# Patient Record
Sex: Female | Born: 1965 | Hispanic: No | Marital: Married | Smoking: Never smoker
Health system: Southern US, Community
[De-identification: ages and names within clinical notes are randomized; demographics above are authoritative.]

## PROBLEM LIST (undated history)

## (undated) DIAGNOSIS — E559 Vitamin D deficiency, unspecified: Secondary | ICD-10-CM

---

## 2014-07-15 ENCOUNTER — Other Ambulatory Visit: Payer: Self-pay | Admitting: Anesthesiology

## 2014-07-15 ENCOUNTER — Ambulatory Visit
Admission: RE | Admit: 2014-07-15 | Discharge: 2014-07-15 | Disposition: A | Discharge status: Home / Self Care | Payer: Self-pay | Source: Ambulatory Visit | Attending: Anesthesiology | Admitting: Anesthesiology

## 2014-07-15 DIAGNOSIS — M25562 Pain in left knee: Secondary | ICD-10-CM

## 2018-05-22 ENCOUNTER — Emergency Department (HOSPITAL_BASED_OUTPATIENT_CLINIC_OR_DEPARTMENT_OTHER)
Admission: EM | Admit: 2018-05-22 | Discharge: 2018-05-22 | Disposition: A | Discharge status: Home / Self Care | Payer: BLUE CROSS/BLUE SHIELD | Attending: Emergency Medicine | Admitting: Emergency Medicine

## 2018-05-22 ENCOUNTER — Other Ambulatory Visit: Payer: Self-pay

## 2018-05-22 ENCOUNTER — Emergency Department (HOSPITAL_BASED_OUTPATIENT_CLINIC_OR_DEPARTMENT_OTHER): Payer: BLUE CROSS/BLUE SHIELD

## 2018-05-22 ENCOUNTER — Encounter (HOSPITAL_BASED_OUTPATIENT_CLINIC_OR_DEPARTMENT_OTHER): Payer: Self-pay | Admitting: *Deleted

## 2018-05-22 DIAGNOSIS — R079 Chest pain, unspecified: Secondary | ICD-10-CM | POA: Diagnosis present

## 2018-05-22 DIAGNOSIS — R1012 Left upper quadrant pain: Secondary | ICD-10-CM | POA: Insufficient documentation

## 2018-05-22 DIAGNOSIS — Z79899 Other long term (current) drug therapy: Secondary | ICD-10-CM | POA: Diagnosis not present

## 2018-05-22 HISTORY — DX: Vitamin D deficiency, unspecified: E55.9

## 2018-05-22 LAB — HEPATIC FUNCTION PANEL
ALT: 26 U/L (ref 0–44)
AST: 26 U/L (ref 15–41)
Albumin: 4 g/dL (ref 3.5–5.0)
Alkaline Phosphatase: 70 U/L (ref 38–126)
TOTAL PROTEIN: 7.7 g/dL (ref 6.5–8.1)
Total Bilirubin: 0.4 mg/dL (ref 0.3–1.2)

## 2018-05-22 LAB — BASIC METABOLIC PANEL
ANION GAP: 7 (ref 5–15)
BUN: 15 mg/dL (ref 6–20)
CALCIUM: 9.1 mg/dL (ref 8.9–10.3)
CO2: 28 mmol/L (ref 22–32)
CREATININE: 0.66 mg/dL (ref 0.44–1.00)
Chloride: 103 mmol/L (ref 98–111)
GFR calc Af Amer: >60 mL/min (ref >60)
GFR calc non Af Amer: >60 mL/min (ref >60)
Glucose, Bld: 90 mg/dL (ref 70–99)
Potassium: 3.8 mmol/L (ref 3.5–5.1)
SODIUM: 138 mmol/L (ref 135–145)

## 2018-05-22 LAB — TROPONIN I

## 2018-05-22 LAB — CBC
HCT: 40.7 % (ref 36.0–46.0)
HEMOGLOBIN: 13.9 g/dL (ref 12.0–15.0)
MCH: 29 pg (ref 26.0–34.0)
MCHC: 34.2 g/dL (ref 30.0–36.0)
MCV: 85 fL (ref 78.0–100.0)
PLATELETS: 240 10*3/uL (ref 150–400)
RBC: 4.79 MIL/uL (ref 3.87–5.11)
RDW: 13.1 % (ref 11.5–15.5)
WBC: 8.8 10*3/uL (ref 4.0–10.5)

## 2018-05-22 LAB — LIPASE, BLOOD: Lipase: 28 U/L (ref 11–51)

## 2018-05-22 MED ORDER — GI COCKTAIL ~~LOC~~
30.0000 mL | Freq: Once | ORAL | Status: AC
  Administered 2018-05-22: 30 mL via ORAL
  Filled 2018-05-22: qty 30

## 2018-05-22 NOTE — Discharge Instructions (Signed)
Your lab tests are normal.  Your EKG and chest x-ray are normal as well.  I am unsure exactly what is causing her pain.  The 2 most likely things are you pulled a muscle in your chest wall or you are having reflux disease.  I will have you do a trial of Zantac or Pepcid, take either 1 twice a day for the next week.  Try to avoid things that may make this worse like spicy foods tomato based products fatty foods chocolate or peppermint.  Please schedule an appointment with your family physician.  Return for fevers denies any pain or inability to eat or drink.

## 2018-05-22 NOTE — ED Notes (Signed)
Pt reports GI cocktail did not provide relief.

## 2018-05-22 NOTE — ED Triage Notes (Signed)
Pt reports over one week of back pain, no injury, mid back radiating to left breast area, tender to palp at triage, increases with movements and positioning, denies sob, dizzyness, nausea or diaphoresis. Pt is guarding her left side at triage.

## 2018-05-22 NOTE — ED Provider Notes (Signed)
[facility] EMERGENCY DEPARTMENT Provider Note   CSN: [id number] Arrival date & time: 05/22/18  1229     History   Chief Complaint Chief Complaint  Patient presents with  . Chest Pain    HPI Valerie Hardy is a 52 y.o. female.  52 yo F with LUQ abdominal pain.  Going on for the past month.  Not sure what makes it better or worse.  Describes as burning.  Not worse with foods, denies injury, denies vomiting.  No prior abdominal surgery, denies rash.    The history is provided by the patient.  Chest Pain   This is a new problem. The current episode started more than 1 week ago. The problem occurs constantly. The problem has not changed since onset.The pain is present in the lateral region. The pain is at a severity of 4/10. The pain is moderate. The quality of the pain is described as burning. The pain does not radiate. Duration of episode(s) is 3 months. Associated symptoms include abdominal pain. Pertinent negatives include no dizziness, no fever, no headaches, no nausea, no palpitations, no shortness of breath and no vomiting. She has tried nothing for the symptoms. The treatment provided no relief.    Past Medical History:  Diagnosis Date  . Vitamin D deficiency     There are no active problems to display for this patient.   History reviewed. No pertinent surgical history.   OB History   None      Home Medications    Prior to Admission medications   Medication Sig Start Date End Date Taking? Authorizing Provider  clotrimazole (LOTRIMIN) 1 % cream Apply 1 application topically 2 (two) times daily.   Yes [provider]  cyclobenzaprine (FLEXERIL) 5 MG tablet Take 5 mg by mouth 3 (three) times daily as needed for muscle spasms.   Yes [provider]  oxyCODONE-acetaminophen (PERCOCET/ROXICET) 5-325 MG tablet Take by mouth every 4 (four) hours as needed for severe pain.   Yes [provider]    Family History History reviewed. No  pertinent family history.  Social History Social History   Tobacco Use  . Smoking status: Never Smoker  . Smokeless tobacco: Never Used  Substance Use Topics  . Alcohol use: Not on file  . Drug use: Not on file     Allergies   Patient has no known allergies.   Review of Systems Review of Systems  Constitutional: Negative for chills and fever.  HENT: Negative for congestion and rhinorrhea.   Eyes: Negative for redness and visual disturbance.  Respiratory: Negative for shortness of breath and wheezing.   Cardiovascular: Positive for chest pain. Negative for palpitations.  Gastrointestinal: Positive for abdominal pain. Negative for nausea and vomiting.  Genitourinary: Negative for dysuria and urgency.  Musculoskeletal: Negative for arthralgias and myalgias.  Skin: Negative for pallor and wound.  Neurological: Negative for dizziness and headaches.     Physical Exam Updated Vital Signs BP 123/76 (BP Location: Right Arm)   Pulse 70   Temp 98.5 F (36.9 C) (Oral)   Resp 20   Ht 5' (1.524 m)   Wt 81.6 kg   SpO2 100%   BMI 35.15 kg/m   Physical Exam  Constitutional: She is oriented to person, place, and time. She appears well-developed and well-nourished. No distress.  HENT:  Head: Normocephalic and atraumatic.  Eyes: Pupils are equal, round, and reactive to light. EOM are normal.  Neck: Normal range of motion. Neck supple.  Cardiovascular:  Normal rate and regular rhythm. Exam reveals no gallop and no friction rub.  No murmur heard. Pulmonary/Chest: Effort normal. She has no wheezes. She has no rales.  Abdominal: Soft. She exhibits no distension. There is no tenderness.  Musculoskeletal: She exhibits no edema or tenderness.  ttp about the left anterior chest wall, about the left costal margin. Some tenderness to the LUQ, difficult to tell if it is worst to the chest wall.   Neurological: She is alert and oriented to person, place, and time.  Skin: Skin is warm and  dry. She is not diaphoretic.  Psychiatric: She has a normal mood and affect. Her behavior is normal.  Nursing note and vitals reviewed.    ED Treatments / Results  Labs (all labs ordered are listed, but only abnormal results are displayed) Labs Reviewed  BASIC METABOLIC PANEL  CBC  TROPONIN I  HEPATIC FUNCTION PANEL  LIPASE, BLOOD    EKG EKG Interpretation  Date/Time:  Monday May 22 2018 12:59:48 EDT Ventricular Rate:  86 PR Interval:  146 QRS Duration: 78 QT Interval:  356 QTC Calculation: 426 R Axis:   81 Text Interpretation:  Normal sinus rhythm Normal ECG No old tracing to compare Confirmed by Melene PlanFloyd, Kady Toothaker [phone]) on 05/22/2018 3:26:32 PM   Radiology Dg Chest 2 View  Result Date: 05/22/2018 CLINICAL DATA:  Chest pain, mid back pain EXAM: CHEST - 2 VIEW COMPARISON:  None. FINDINGS: Heart is upper limits normal in size. No confluent airspace opacities or effusions. No acute bony abnormality. IMPRESSION: No active cardiopulmonary disease. Electronically Signed   By: Charlett NoseKevin  Dover M.D.   On: 05/22/2018 14:21    Procedures Procedures (including critical care time)  Medications Ordered in ED Medications  gi cocktail (Maalox,Lidocaine,Donnatal) (30 mLs Oral Given 05/22/18 1551)     Initial Impression / Assessment and Plan / ED Course  I have reviewed the triage vital signs and the nursing notes.  Pertinent labs & imaging results that were available during my care of the patient were reviewed by me and considered in my medical decision making (see chart for details).     52 yo F with a cc of luq abdominal pain.  Going on for three months.  Pain with palpation to the LUQ, L anterior chest wall.  I suspect most likely muscular, but will give GI cocktail, check LFT, lipase.    LFTs are unremarkable.  CBC without anemia.  Troponin is negative.  I feel this is unlikely to be ACS based on history and physical.  Will have the patient to a trial of Pepcid or Zantac.  Have her  follow-up with her PCP.  4:49 PM:  I have discussed the diagnosis/risks/treatment options with the patient and family and believe the pt to be eligible for discharge home to follow-up with PCP. We also discussed returning to the ED immediately if new or worsening sx occur. We discussed the sx which are most concerning (e.g., sudden worsening pain, fever, inability to tolerate by mouth) that necessitate immediate return. Medications administered to the patient during their visit and any new prescriptions provided to the patient are listed below.  Medications given during this visit Medications  gi cocktail (Maalox,Lidocaine,Donnatal) (30 mLs Oral Given 05/22/18 1551)      The patient appears reasonably screen and/or stabilized for discharge and I doubt any other medical condition or other [facility] requiring further screening, evaluation, or treatment in the ED at this time prior to discharge.    Final Clinical  Impressions(s) / ED Diagnoses   Final diagnoses:  LUQ abdominal pain    ED Discharge Orders    None       Melene PlanFloyd, Adan Beal, DO 05/22/18 1649

## 2019-10-23 IMAGING — DX DG CHEST 2V
2 series · 2 of 2 positions shown · non-contrast
Comparison: None.

CLINICAL DATA: Chest pain, mid back pain

EXAM:
CHEST - 2 VIEW

[chest pa]
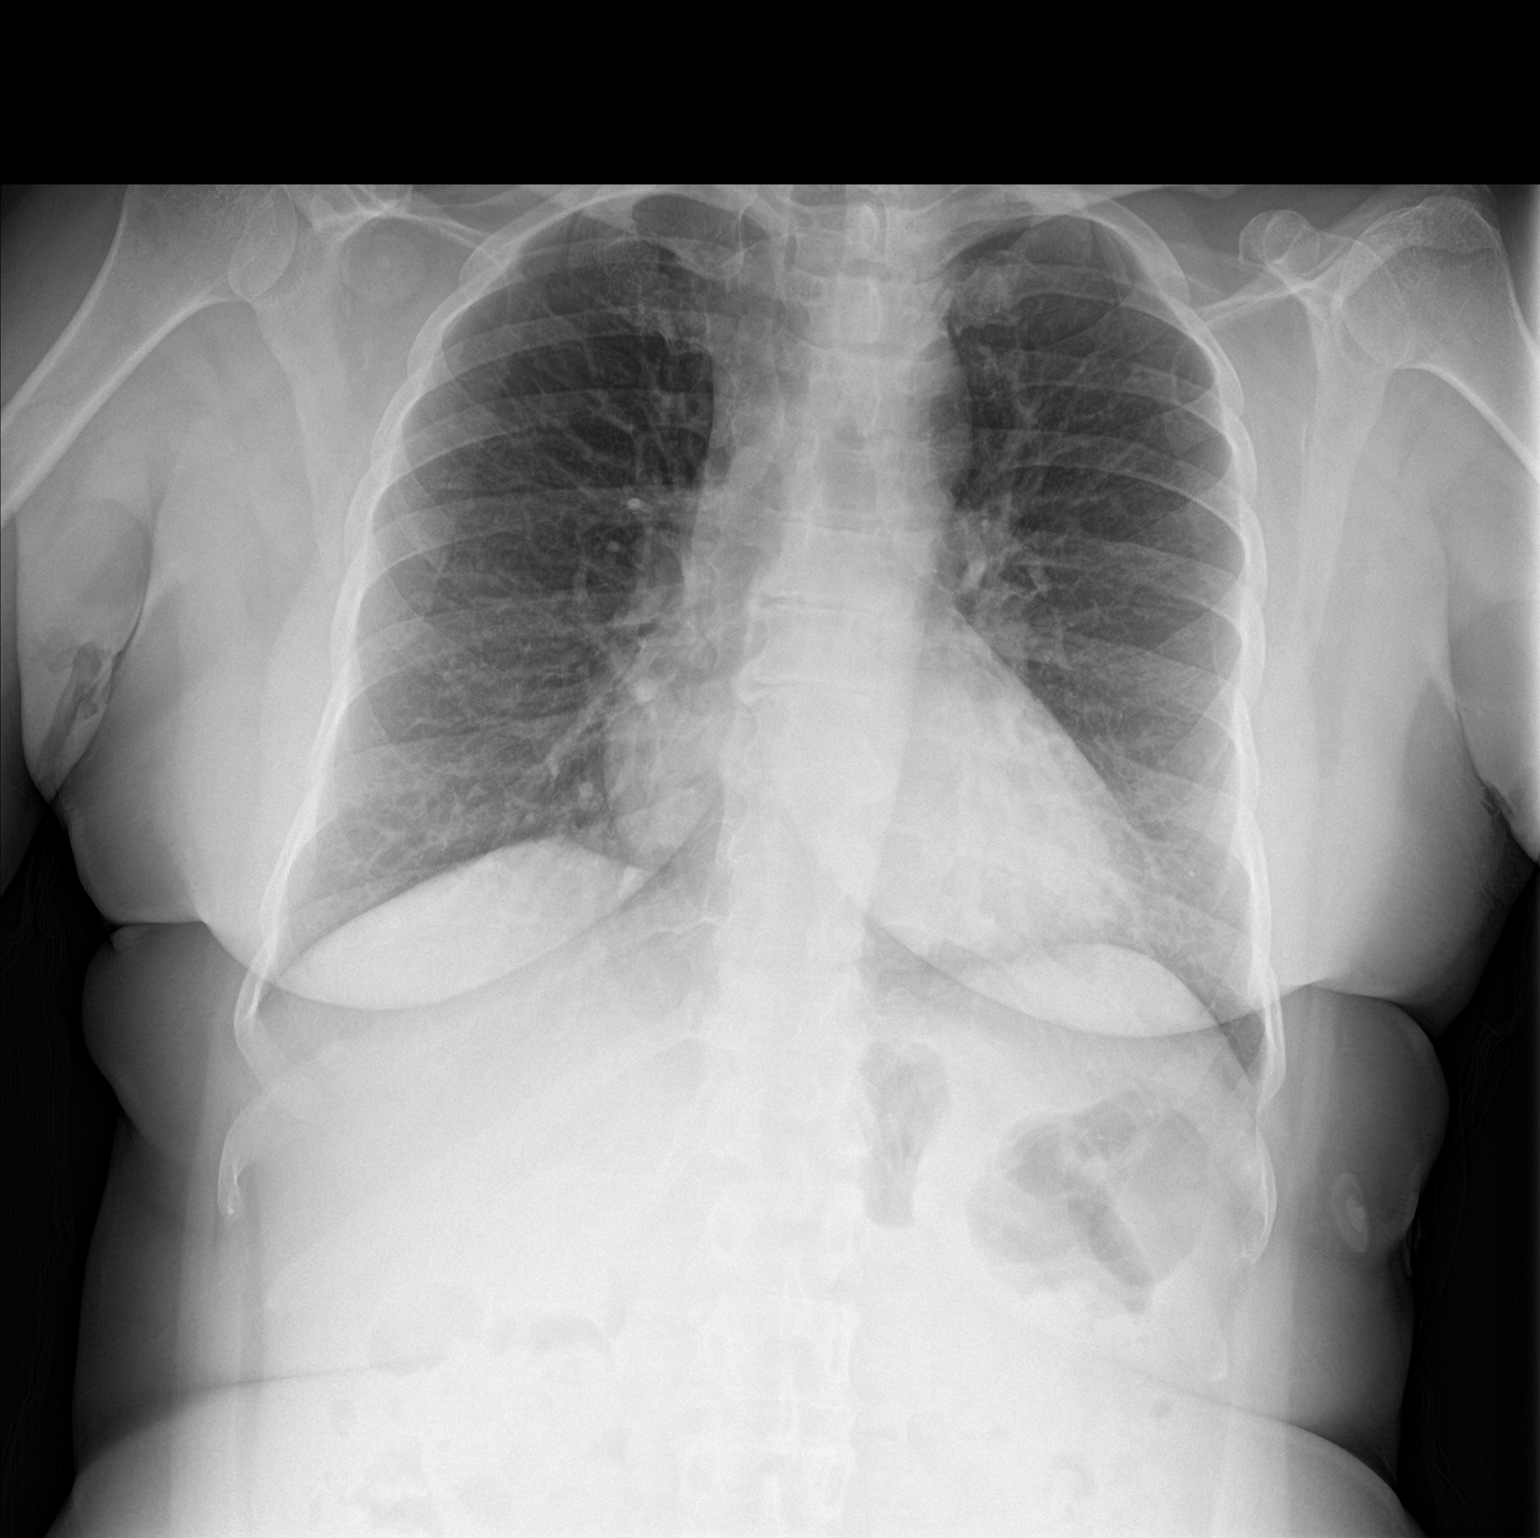

[chest lat]
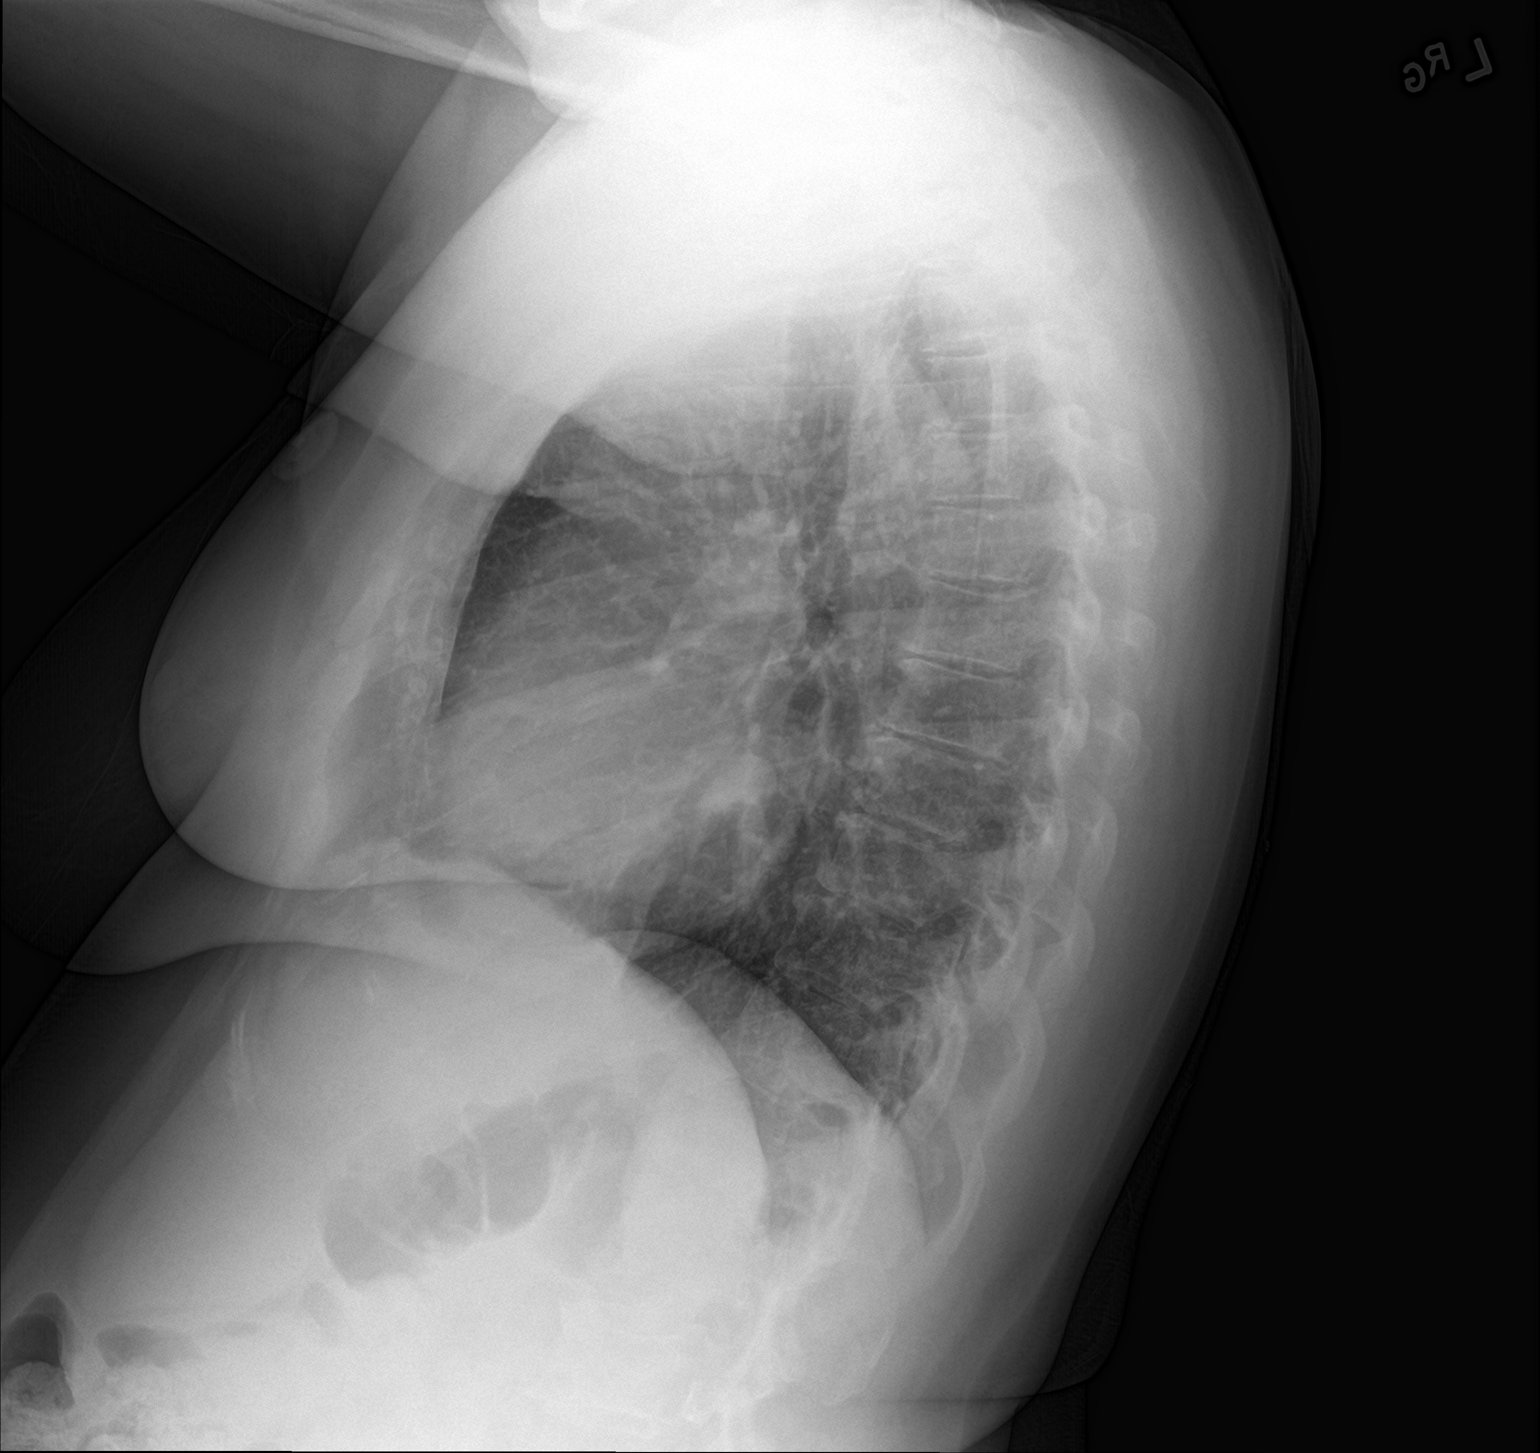

[2 of 2 positions shown; findings below may reference images not displayed]

FINDINGS: Heart is upper limits normal in size. No confluent airspace
opacities or effusions. No acute bony abnormality.
IMPRESSION: No active cardiopulmonary disease.
